# Patient Record
Sex: Female | Born: 2005 | Race: White | Hispanic: No | Marital: Single | Smoking: Former smoker
Health system: Southern US, Community
[De-identification: ages and names within clinical notes are randomized; demographics above are authoritative.]

## PROBLEM LIST (undated history)

## (undated) DIAGNOSIS — A749 Chlamydial infection, unspecified: Secondary | ICD-10-CM

## (undated) DIAGNOSIS — N289 Disorder of kidney and ureter, unspecified: Secondary | ICD-10-CM

## (undated) DIAGNOSIS — J45909 Unspecified asthma, uncomplicated: Secondary | ICD-10-CM

## (undated) HISTORY — PX: KIDNEY SURGERY: SHX687

## (undated) HISTORY — DX: Chlamydial infection, unspecified: A74.9

## (undated) HISTORY — PX: NEPHRECTOMY: SHX65

---
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
## 2024-03-17 ENCOUNTER — Ambulatory Visit: Payer: MEDICAID
